# Patient Record
Sex: Female | Born: 1960
Health system: Southern US, Community
[De-identification: ages and names within clinical notes are randomized; demographics above are authoritative.]

## PROBLEM LIST (undated history)

## (undated) HISTORY — PX: TONSILLECTOMY: SUR1361

## (undated) HISTORY — PX: TUBAL LIGATION: SHX77

---

## 1999-06-11 ENCOUNTER — Encounter: Payer: Self-pay | Admitting: Family Medicine

## 1999-06-11 ENCOUNTER — Encounter: Admission: RE | Admit: 1999-06-11 | Discharge: 1999-06-11 | Payer: Self-pay | Admitting: Family Medicine

## 2005-05-09 ENCOUNTER — Other Ambulatory Visit: Admission: RE | Admit: 2005-05-09 | Discharge: 2005-05-09 | Payer: Self-pay | Admitting: Family Medicine

## 2005-06-13 ENCOUNTER — Encounter: Admission: RE | Admit: 2005-06-13 | Discharge: 2005-06-13 | Payer: Self-pay | Admitting: Family Medicine

## 2006-04-11 ENCOUNTER — Ambulatory Visit: Payer: Self-pay | Admitting: Internal Medicine

## 2008-07-21 ENCOUNTER — Other Ambulatory Visit: Admission: RE | Admit: 2008-07-21 | Discharge: 2008-07-21 | Payer: Self-pay | Admitting: Family Medicine

## 2008-07-21 ENCOUNTER — Encounter: Admission: RE | Admit: 2008-07-21 | Discharge: 2008-07-21 | Payer: Self-pay | Admitting: Family Medicine

## 2009-07-21 ENCOUNTER — Other Ambulatory Visit: Admission: RE | Admit: 2009-07-21 | Discharge: 2009-07-21 | Payer: Self-pay | Admitting: Family Medicine

## 2010-07-27 ENCOUNTER — Other Ambulatory Visit
Admission: RE | Admit: 2010-07-27 | Discharge: 2010-07-27 | Payer: Self-pay | Source: Home / Self Care | Admitting: Family Medicine

## 2010-08-30 ENCOUNTER — Encounter
Admission: RE | Admit: 2010-08-30 | Discharge: 2010-08-30 | Payer: Self-pay | Source: Home / Self Care | Attending: Family Medicine | Admitting: Family Medicine

## 2010-09-12 ENCOUNTER — Encounter
Admission: RE | Admit: 2010-09-12 | Discharge: 2010-09-12 | Payer: Self-pay | Source: Home / Self Care | Attending: Family Medicine | Admitting: Family Medicine

## 2010-09-18 ENCOUNTER — Other Ambulatory Visit: Payer: Self-pay | Admitting: Radiology

## 2010-09-18 ENCOUNTER — Encounter
Admission: RE | Admit: 2010-09-18 | Discharge: 2010-09-18 | Payer: Self-pay | Source: Home / Self Care | Attending: Family Medicine | Admitting: Family Medicine

## 2010-10-09 ENCOUNTER — Other Ambulatory Visit: Payer: Self-pay | Admitting: Surgery

## 2010-10-09 DIAGNOSIS — R928 Other abnormal and inconclusive findings on diagnostic imaging of breast: Secondary | ICD-10-CM

## 2010-11-15 ENCOUNTER — Encounter (HOSPITAL_BASED_OUTPATIENT_CLINIC_OR_DEPARTMENT_OTHER)
Admission: RE | Admit: 2010-11-15 | Discharge: 2010-11-15 | Disposition: A | Discharge status: Home / Self Care | Payer: BC Managed Care – PPO | Source: Ambulatory Visit | Attending: Surgery | Admitting: Surgery

## 2010-11-16 ENCOUNTER — Other Ambulatory Visit: Payer: Self-pay | Admitting: Surgery

## 2010-11-16 ENCOUNTER — Ambulatory Visit
Admission: RE | Admit: 2010-11-16 | Discharge: 2010-11-16 | Disposition: A | Discharge status: Home / Self Care | Payer: BC Managed Care – PPO | Source: Ambulatory Visit | Attending: Surgery | Admitting: Surgery

## 2010-11-16 ENCOUNTER — Ambulatory Visit (HOSPITAL_BASED_OUTPATIENT_CLINIC_OR_DEPARTMENT_OTHER)
Admission: RE | Admit: 2010-11-16 | Discharge: 2010-11-16 | Disposition: A | Discharge status: Home / Self Care | Payer: BC Managed Care – PPO | Source: Ambulatory Visit | Attending: Surgery | Admitting: Surgery

## 2010-11-16 DIAGNOSIS — N6089 Other benign mammary dysplasias of unspecified breast: Secondary | ICD-10-CM | POA: Insufficient documentation

## 2010-11-16 DIAGNOSIS — Z01812 Encounter for preprocedural laboratory examination: Secondary | ICD-10-CM | POA: Insufficient documentation

## 2010-11-16 DIAGNOSIS — R928 Other abnormal and inconclusive findings on diagnostic imaging of breast: Secondary | ICD-10-CM

## 2010-11-16 LAB — POCT HEMOGLOBIN-HEMACUE: Hemoglobin: 14.7 g/dL (ref 12.0–15.0)

## 2010-12-03 NOTE — Op Note (Signed)
  NAME:  Dawn Walter, Dawn Walter                  ACCOUNT NO.:  [id number]  MEDICAL RECORD NO.:  [id number]           PATIENT TYPE:  AMB  LOCATION:                               FACILITY:  [facility]  PHYSICIAN:  Wilmon Arms. Corliss Skains, M.D. DATE OF BIRTH:  [birth year]  DATE OF PROCEDURE:  11/16/2010 DATE OF DISCHARGE:                              OPERATIVE REPORT   PREOPERATIVE DIAGNOSIS:  Abnormal left mammogram.  POSTOPERATIVE DIAGNOSIS:  Abnormal left mammogram.  PROCEDURE:  Left needle-localized lumpectomy.  SURGEON:  Wilmon Arms. Corliss Skains, MD  ANESTHESIA:  General.  INDICATIONS:  This is a 50 year old female, who [facility]  She was noted to have abnormality in the left breast at 3 o'clock.  Ultrasound showed a 9 x 6 x 3 mm hypoechoic mass of the left breast.  Needle biopsy showed fibrocystic changes with extensive apocrine metaplasia.  After further discussion, the patient presents for excisional biopsy of this area.  DESCRIPTION OF PROCEDURE:  The patient was brought to the operating room and placed in supine position on operating table.  A wire had previously been placed by Radiology.  After an adequate level of general anesthesia was obtained, her left breast was prepped with ChloraPrep and draped in sterile fashion.  Time-out was taken to ensure the proper patient, proper procedure.  We made a transverse elliptical incision around the wire.  Dissection was carried down into the breast tissue.  We raised a cylinder of tissue around the wire.  We extended this down about 6-cm deep until we passed the tip of the wire.  The specimen was removed and was oriented with a kit.  We inspected the biopsy cavity for hemostasis. Faxitron image showed that the clip was in the center of the specimen and that the wire was intact.  This was sent for pathologic examination. The wound was closed with a deep layer of 3-0 Vicryl and a subcuticular 4-0  Monocryl.  Steri-Strips and clean dressing were applied.  The patient was then extubated and brought to the recovery room in stable condition.  All sponge, instrument, and needle counts were correct.     Wilmon Arms. Corliss Skains, M.D.     MKT/MEDQ  D:  11/16/2010  T:  11/17/2010  Job:  [id number]  cc:   L. Lupe Carney, M.D.  Electronically Signed by Manus Rudd M.D. on 12/03/2010 10:40:30 AM

## 2011-01-04 NOTE — Assessment & Plan Note (Signed)
[facility]                               PULMONARY OFFICE NOTE   NAME:Dawn Walter, Dawn Walter                         MRN:          [id number]  DATE:04/11/2006                            DOB:          [birth year]    PULMONARY NEW PATIENT EVALUATION:   HISTORY:  This is a 50 year old white female with paroxysms of dyspnea  starting 2 months ago with no obvious trigger.  She quite smoking 23 years  ago, but denies any chronic respiratory complaints then, and actually does  not have reproducible or proportionate dyspnea with exertion or activity.  She also has no nocturnal events.  She states that she suddenly loses her  breath for no reason associated with minimal cough, chest tightness, but no  pleuritic pain, fever, chills, sweats, orthopnea, PND or leg swelling, and  she has not found anything that makes it better or worse, nor consulted her  primary physician, Dr. Lupe Carney.   PAST MEDICAL HISTORY:  1. Cardiac rhythm problems.  2. Allergies.   MEDICATIONS:  None daily.   SOCIAL HISTORY:  She quite smoking 23 years ago.  Works as a Catering manager.   FAMILY HISTORY:  Recorded in detail and reviewed with the patient.  Negative  for respiratory diseases or cardiac disease.   REVIEW OF SYSTEMS:  Taken in detail on the worksheet.  Significant for the  problems as outlined above.   PHYSICAL EXAMINATION:  GENERAL:  This is a pleasant white female who laughs  inappropriately at serious medical questions.  VITAL SIGNS:  Stable.  O2 saturation 97% on room air.  HEENT:  Unremarkable.  Oropharynx is clear.  Nasal turbinates normal.  Ear  canals clear bilaterally.  LUNGS:  Lung fields are perfectly clear bilaterally to auscultation and  percussion with excellent air movement.  HEART:  Regular rate and rhythm without murmur, gallop, or rub.  ABDOMEN:  Soft, benign.  EXTREMITIES:  Warm without calf tenderness, cyanosis, clubbing or edema.   CHEST X-RAY:   Pending.   LABORATORY DATA:  Pending.   IMPRESSION:  Paroxysms of dyspnea that do not recur reproducibly with  exertion, and, at the same time, has apparently exercise tolerance and no  effect on sleep.  Sounds functional to me.  This, plus her personality,  would strongly suggest this diagnosis, and wonder about the possibility of  mitral valve prolapse syndrome here.   However, because this is a new onset 2 months ago abruptly, I will recommend  a work-up with basic lab work, chest x-ray and a trial of PPI therapy in the  form of Protonix taken 40 mg before breakfast daily along with dietary  information directed at gastroesophageal reflux disease, since  gastroesophageal reflux disease certainly can cause atypical chest pain and  dyspnea syndromes in middle-aged females.   I did explain hyperventilation syndrome to the patient as a failure to  relax between breaths to try to help her understand the problem, but do not  recommend any anxiolytics at this point through this office.   Followup here will be in 4 weeks,  sooner p.r.n.                                   Charlaine Dalton. Sherene Sires, MD, [facility]   MBW/MedQ  DD:  04/12/2006  DT:  04/12/2006  Job #:  [id number]   cc:   L. Lupe Carney, MD

## 2011-01-22 ENCOUNTER — Encounter (INDEPENDENT_AMBULATORY_CARE_PROVIDER_SITE_OTHER): Payer: Self-pay | Admitting: Surgery

## 2011-08-14 ENCOUNTER — Other Ambulatory Visit (HOSPITAL_COMMUNITY)
Admission: RE | Admit: 2011-08-14 | Discharge: 2011-08-14 | Disposition: A | Discharge status: Home / Self Care | Payer: BC Managed Care – PPO | Source: Ambulatory Visit | Attending: Family Medicine | Admitting: Family Medicine

## 2011-08-14 ENCOUNTER — Other Ambulatory Visit: Payer: Self-pay | Admitting: Physician Assistant

## 2011-08-14 DIAGNOSIS — Z124 Encounter for screening for malignant neoplasm of cervix: Secondary | ICD-10-CM | POA: Insufficient documentation

## 2011-10-11 ENCOUNTER — Other Ambulatory Visit: Payer: Self-pay | Admitting: Gastroenterology

## 2012-04-01 DIAGNOSIS — D126 Benign neoplasm of colon, unspecified: Secondary | ICD-10-CM | POA: Diagnosis present

## 2012-04-01 NOTE — H&P (Signed)
  Date of Initial H&P: 03/20/12  History reviewed, patient examined, no change in status, stable for surgery.

## 2012-04-01 NOTE — Interval H&P Note (Signed)
History and Physical Interval Note:  04/01/2012 5:48 PM  Dawn Walter  has presented today for surgery, with the diagnosis of hx polyp  The various methods of treatment have been discussed with the patient and family. After consideration of risks, benefits and other options for treatment, the patient has consented to  Procedure(s) (LRB): COLONOSCOPY (N/A) HOT HEMOSTASIS (ARGON PLASMA COAGULATION/BICAP) (N/A) as a surgical intervention .  The patient's history has been reviewed, patient examined, no change in status, stable for surgery.  I have reviewed the patient's chart and labs.  Questions were answered to the patient's satisfaction.     Elyse Prevo C.

## 2012-04-02 ENCOUNTER — Encounter (HOSPITAL_COMMUNITY): Payer: Self-pay | Admitting: Anesthesiology

## 2012-04-02 ENCOUNTER — Encounter (HOSPITAL_COMMUNITY): Payer: Self-pay | Admitting: *Deleted

## 2012-04-02 ENCOUNTER — Encounter (HOSPITAL_COMMUNITY)
Admission: RE | Disposition: A | Discharge status: Home / Self Care | Payer: Self-pay | Source: Ambulatory Visit | Attending: Gastroenterology

## 2012-04-02 ENCOUNTER — Ambulatory Visit (HOSPITAL_COMMUNITY): Payer: BC Managed Care – PPO | Admitting: Anesthesiology

## 2012-04-02 ENCOUNTER — Ambulatory Visit (HOSPITAL_COMMUNITY)
Admission: RE | Admit: 2012-04-02 | Discharge: 2012-04-02 | Disposition: A | Discharge status: Home / Self Care | Payer: BC Managed Care – PPO | Source: Ambulatory Visit | Attending: Gastroenterology | Admitting: Gastroenterology

## 2012-04-02 DIAGNOSIS — K648 Other hemorrhoids: Secondary | ICD-10-CM | POA: Insufficient documentation

## 2012-04-02 DIAGNOSIS — D126 Benign neoplasm of colon, unspecified: Secondary | ICD-10-CM | POA: Insufficient documentation

## 2012-04-02 DIAGNOSIS — K5730 Diverticulosis of large intestine without perforation or abscess without bleeding: Secondary | ICD-10-CM | POA: Insufficient documentation

## 2012-04-02 HISTORY — PX: COLONOSCOPY: SHX5424

## 2012-04-02 HISTORY — PX: HOT HEMOSTASIS: SHX5433

## 2012-04-02 SURGERY — COLONOSCOPY
Anesthesia: Monitor Anesthesia Care

## 2012-04-02 MED ORDER — LIDOCAINE HCL (CARDIAC) 20 MG/ML IV SOLN
INTRAVENOUS | Status: DC | PRN
  Administered 2012-04-02: 50 mg via INTRAVENOUS

## 2012-04-02 MED ORDER — LACTATED RINGERS IV SOLN
INTRAVENOUS | Status: DC
  Administered 2012-04-02: 1000 mL via INTRAVENOUS

## 2012-04-02 MED ORDER — MIDAZOLAM HCL 5 MG/5ML IJ SOLN
INTRAMUSCULAR | Status: DC | PRN
  Administered 2012-04-02 (×2): 1 mg via INTRAVENOUS

## 2012-04-02 MED ORDER — KETAMINE HCL 10 MG/ML IJ SOLN
INTRAMUSCULAR | Status: DC | PRN
  Administered 2012-04-02 (×2): 10 mg via INTRAVENOUS

## 2012-04-02 MED ORDER — FENTANYL CITRATE 0.05 MG/ML IJ SOLN: 25.0000 ug | INTRAMUSCULAR | Status: DC | PRN

## 2012-04-02 MED ORDER — PROPOFOL 10 MG/ML IV EMUL
INTRAVENOUS | Status: DC | PRN
  Administered 2012-04-02: 160 ug/kg/min via INTRAVENOUS

## 2012-04-02 MED ORDER — LACTATED RINGERS IV SOLN
INTRAVENOUS | Status: DC | PRN
  Administered 2012-04-02 (×2): via INTRAVENOUS

## 2012-04-02 MED ORDER — FENTANYL CITRATE 0.05 MG/ML IJ SOLN
INTRAMUSCULAR | Status: DC | PRN
  Administered 2012-04-02 (×2): 50 ug via INTRAVENOUS

## 2012-04-02 MED ORDER — SODIUM CHLORIDE 0.9 % IV SOLN: INTRAVENOUS | Status: DC

## 2012-04-02 NOTE — Preoperative (Signed)
Beta Blockers   Reason not to administer Beta Blockers:Not Applicable 

## 2012-04-02 NOTE — Transfer of Care (Signed)
Immediate Anesthesia Transfer of Care Note  Patient: Dawn Walter  Procedure(s) Performed: Procedure(s) (LRB): COLONOSCOPY (N/A) HOT HEMOSTASIS (ARGON PLASMA COAGULATION/BICAP) (N/A)  Patient Location: PACU and Endoscopy Unit  Anesthesia Type: MAC  Level of Consciousness: awake, alert , oriented and patient cooperative  Airway & Oxygen Therapy: Patient Spontanous Breathing and Patient connected to face mask oxygen  Post-op Assessment: Report given to PACU RN, Post -op Vital signs reviewed and stable and Patient moving all extremities  Post vital signs: Reviewed and stable  Complications: No apparent anesthesia complications

## 2012-04-02 NOTE — Anesthesia Postprocedure Evaluation (Signed)
  Anesthesia Post-op Note  Patient: Dawn Walter  Procedure(s) Performed: Procedure(s) (LRB): COLONOSCOPY (N/A) HOT HEMOSTASIS (ARGON PLASMA COAGULATION/BICAP) (N/A)  Patient Location: PACU  Anesthesia Type: MAC  Level of Consciousness: awake and alert   Airway and Oxygen Therapy: Patient Spontanous Breathing  Post-op Pain: mild  Post-op Assessment: Post-op Vital signs reviewed, Patient's Cardiovascular Status Stable, Respiratory Function Stable, Patent Airway and No signs of Nausea or vomiting  Post-op Vital Signs: stable  Complications: No apparent anesthesia complications

## 2012-04-02 NOTE — Anesthesia Preprocedure Evaluation (Signed)
Anesthesia Evaluation  Patient identified by MRN, date of birth, ID band Patient awake    Reviewed: Allergy & Precautions, H&P , NPO status , Patient's Chart, lab work & pertinent test results  Airway Mallampati: II TM Distance: >3 FB Neck ROM: full    Dental No notable dental hx.    Pulmonary neg pulmonary ROS,  breath sounds clear to auscultation  Pulmonary exam normal       Cardiovascular Exercise Tolerance: Good negative cardio ROS  Rhythm:regular Rate:Normal     Neuro/Psych negative neurological ROS  negative psych ROS   GI/Hepatic negative GI ROS, Neg liver ROS,   Endo/Other  negative endocrine ROS  Renal/GU negative Renal ROS  negative genitourinary   Musculoskeletal   Abdominal   Peds  Hematology negative hematology ROS (+)   Anesthesia Other Findings   Reproductive/Obstetrics negative OB ROS                           Anesthesia Physical Anesthesia Plan  ASA: I  Anesthesia Plan: MAC   Post-op Pain Management:    Induction:   Airway Management Planned:   Additional Equipment:   Intra-op Plan:   Post-operative Plan:   Informed Consent: I have reviewed the patients History and Physical, chart, labs and discussed the procedure including the risks, benefits and alternatives for the proposed anesthesia with the patient or authorized representative who has indicated his/her understanding and acceptance.   Dental Advisory Given  Plan Discussed with: CRNA and Surgeon  Anesthesia Plan Comments:         Anesthesia Quick Evaluation

## 2012-04-02 NOTE — Interval H&P Note (Signed)
History and Physical Interval Note:  04/02/2012 9:22 AM  Dawn Walter  has presented today for surgery, with the diagnosis of hx polyp  The various methods of treatment have been discussed with the patient and family. After consideration of risks, benefits and other options for treatment, the patient has consented to  Procedure(s) (LRB): COLONOSCOPY (N/A) HOT HEMOSTASIS (ARGON PLASMA COAGULATION/BICAP) (N/A) as a surgical intervention .  The patient's history has been reviewed, patient examined, no change in status, stable for surgery.  I have reviewed the patient's chart and labs.  Questions were answered to the patient's satisfaction.     [name] C.

## 2012-04-02 NOTE — Op Note (Signed)
[facility] [address] [place], [place]  [address]  COLONOSCOPY PROCEDURE REPORT  PATIENT:  Dawn, Walter  MR#:  [id number] BIRTHDATE:  18-Apr-1961, 51 yrs. old  GENDER:  female ENDOSCOPIST:  Charlott Rakes, MD REF. BY: PROCEDURE DATE:  04/02/2012 PROCEDURE:  Colonoscopy with snare polypectomy and APC ASA CLASS:  Class I INDICATIONS:   history colon polyp MEDICATIONS:  None  DESCRIPTION OF PROCEDURE:   After the risks benefits and alternatives of the procedure were thoroughly explained, informed consent was obtained.  The Pentax Ped Colon [id number] endoscope was introduced through the anus and advanced to the cecum where the ileocecal valve and appendiceal orifice were identified. Due to excessive looping repeated loop reduction was necessary.  The quality of the prep was fair but repeated irrigation led to a good and adequate prep.  The instrument was then slowly withdrawn as the colon was fully examined. <<PROCEDUREIMAGES>>  FINDINGS:  Rectal exam unremarkable.  Pediatric colonoscope inserted into the colon and advanced to the cecum, where the appendiceal orifice and ileocecal valve were identified.    During advancement into the descending colon the previous tattoo site was noted and residual polyp was seen. On careful withdrawal of the colonoscope this polyp was attempted to be removed with snare cautery but due to the difficult angle, saline was injected into the proximal edge of polyp to raise up the proximal portion toward the scope.  After injecting saline snare cautery was used to remove the polyp in piecemeal fashion. A small portion of the residual polyp  could not be removed with the snare cautery due to its flat nature. Argon plasma coagulation was then used to fulgurate the remaining portion of the polyp with good results. A few sigmoid diverticula were noted on withdrawal.  Retroflexion revealed small nonthrombosed internal  hemorrhoids  COMPLICATIONS:  None  IMPRESSION:     1. Residual ascending colon polyp removed with snare cautery and argon plasma coagulation-no residual polyp remaining after these techniques were performed 2. Sigmoid diverticulosis 3. Internal hemorrhoids  RECOMMENDATIONS:  1. F/U on path 2. Consider repeat colonoscopy in 1 year to look at polypectomy site  ______________________________ Charlott Rakes, MD  CC:  n. eSIGNEDCharlott Rakes at 04/02/2012 10:46 AM  Dawn Walter, Dawn Walter, [phone]

## 2012-04-03 ENCOUNTER — Encounter (HOSPITAL_COMMUNITY): Payer: Self-pay | Admitting: Gastroenterology

## 2012-08-17 ENCOUNTER — Other Ambulatory Visit (HOSPITAL_COMMUNITY)
Admission: RE | Admit: 2012-08-17 | Discharge: 2012-08-17 | Disposition: A | Discharge status: Home / Self Care | Payer: BC Managed Care – PPO | Source: Ambulatory Visit | Attending: Family Medicine | Admitting: Family Medicine

## 2012-08-17 ENCOUNTER — Other Ambulatory Visit: Payer: Self-pay | Admitting: Physician Assistant

## 2012-08-17 ENCOUNTER — Other Ambulatory Visit: Payer: Self-pay | Admitting: Family Medicine

## 2012-08-17 DIAGNOSIS — Z01419 Encounter for gynecological examination (general) (routine) without abnormal findings: Secondary | ICD-10-CM | POA: Insufficient documentation

## 2012-08-17 DIAGNOSIS — Z1231 Encounter for screening mammogram for malignant neoplasm of breast: Secondary | ICD-10-CM

## 2012-09-03 ENCOUNTER — Ambulatory Visit
Admission: RE | Admit: 2012-09-03 | Discharge: 2012-09-03 | Disposition: A | Discharge status: Home / Self Care | Payer: BC Managed Care – PPO | Source: Ambulatory Visit | Attending: Family Medicine | Admitting: Family Medicine

## 2012-09-03 DIAGNOSIS — Z1231 Encounter for screening mammogram for malignant neoplasm of breast: Secondary | ICD-10-CM

## 2013-05-11 ENCOUNTER — Ambulatory Visit (INDEPENDENT_AMBULATORY_CARE_PROVIDER_SITE_OTHER): Payer: BC Managed Care – PPO | Admitting: Family Medicine

## 2013-05-11 VITALS — BP 130/80 | HR 70 | Temp 98.0°F | Resp 16 | Ht 64.25 in | Wt 173.6 lb

## 2013-05-11 DIAGNOSIS — R05 Cough: Secondary | ICD-10-CM

## 2013-05-11 DIAGNOSIS — R5381 Other malaise: Secondary | ICD-10-CM

## 2013-05-11 DIAGNOSIS — R52 Pain, unspecified: Secondary | ICD-10-CM

## 2013-05-11 DIAGNOSIS — R059 Cough, unspecified: Secondary | ICD-10-CM

## 2013-05-11 LAB — POCT CBC
MCH, POC: 30.7 pg (ref 27–31.2)
MCHC: 32.2 g/dL (ref 31.8–35.4)
MID (cbc): 0.4 (ref 0–0.9)
MPV: 11.2 fL (ref 0–99.8)
POC MID %: 6.2 %M (ref 0–12)
Platelet Count, POC: 279 10*3/uL (ref 142–424)
RBC: 4.72 M/uL (ref 4.04–5.48)
WBC: 7.1 10*3/uL (ref 4.6–10.2)

## 2013-05-11 MED ORDER — OSELTAMIVIR PHOSPHATE 75 MG PO CAPS: 75.0000 mg | ORAL_CAPSULE | Freq: Two times a day (BID) | ORAL | Status: DC

## 2013-05-11 MED ORDER — HYDROCODONE-HOMATROPINE 5-1.5 MG/5ML PO SYRP: 5.0000 mL | ORAL_SOLUTION | Freq: Three times a day (TID) | ORAL | Status: DC | PRN

## 2013-05-11 NOTE — Patient Instructions (Addendum)
Use the cough syrup as needed.  Rest and drink plenty of fluids.  Use the cough syrup as needed. Remember it will make you drowsy, so do not drive while you are taking this.     Let me know if you are not better in the next couple of days.    We can also try tamiflu in case you may have the flu.

## 2013-05-11 NOTE — Progress Notes (Signed)
[facility] [address], [place] [address] [phone]  Date:  05/11/2013   Name:  Dawn Walter   DOB:  03-13-1961   MRN:  [id number]  PCP:  No primary provider on file.    Chief Complaint: Generalized Body Aches   History of Present Illness:  Dawn Walter is a 52 y.o. very pleasant female patient who presents with the following:  Here today with illness.  Today is Tuesday.  On Saturday she noted body aches, sinus pain, my eyes hurt.  "Everything hurts."  She has not noted any fever- she has checked her temperature a couple of times. She does have some cough.  "I feel like I've been run over by a semi- truck."   It "burns" when she blows her nose.   She does not have any GI sx- no nausea, vomiting or diarrhea.   She has taken nyquil, alka seltzer, robitussin.  No meds so far today.    She is generally healthy Menopause completed.    Here with her daughter today who is also ill with similar sx  Patient Active Problem List   Diagnosis Date Noted  . Benign neoplasm of colon 04/01/2012    No past medical history on file.  Past Surgical History  Procedure Laterality Date  . Cesarean section    . Colonoscopy  04/02/2012    Procedure: COLONOSCOPY;  Surgeon: Shirley Friar, MD;  Location: [facility] ENDOSCOPY;  Service: Endoscopy;  Laterality: N/A;  . Hot hemostasis  04/02/2012    Procedure: HOT HEMOSTASIS (ARGON PLASMA COAGULATION/BICAP);  Surgeon: Shirley Friar, MD;  Location: [facility];  Service: Endoscopy;  Laterality: N/A;    History  Substance Use Topics  . Smoking status: Never Smoker   . Smokeless tobacco: Not on file  . Alcohol Use: Yes     Comment: WEEKENDS SOMETIMES    No family history on file.  No Known Allergies  Medication list has been reviewed and updated.  Current Outpatient Prescriptions on File Prior to Visit  Medication Sig Dispense Refill  . PHENTERMINE HCL PO Take 30 mg by mouth. ALSO, HCG INJECTION.1 A WEEK         No current facility-administered medications on file prior to visit.    Review of Systems:  As per HPI- otherwise negative.   Physical Examination: Filed Vitals:   05/11/13 0811  BP: 130/80  Pulse: 70  Temp: 98 F (36.7 C)  Resp: 16   Filed Vitals:   05/11/13 0811  Height: 5' 4.25" (1.632 m)  Weight: 173 lb 9.6 oz (78.744 kg)   Body mass index is 29.56 kg/(m^2). Ideal Body Weight: Weight in (lb) to have BMI = 25: 146.5  GEN: WDWN, NAD, Non-toxic, A & O x 3, overweight.   HEENT: Atraumatic, Normocephalic. Neck supple. No masses, No LAD.  Bilateral TM wnl, oropharynx normal.  PEERL,EOMI.   Ears and Nose: No external deformity. CV: RRR, No M/G/R. No JVD. No thrill. No extra heart sounds. PULM: CTA B, no wheezes, crackles, rhonchi. No retractions. No resp. distress. No accessory muscle use. ABD: S, NT, ND. No rebound. No HSM. EXTR: No c/c/e NEURO Normal gait.  PSYCH: Normally interactive. Conversant. Not depressed or anxious appearing.  Calm demeanor.   Results for orders placed in visit on 05/11/13  POCT CBC      Result Value Range   WBC 7.1  4.6 - 10.2 K/uL   Lymph, poc 2.2  0.6 -  3.4   POC LYMPH PERCENT 31.6  10 - 50 %L   MID (cbc) 0.4  0 - 0.9   POC MID % 6.2  0 - 12 %M   POC Granulocyte 4.4  2 - 6.9   Granulocyte percent 62.2  37 - 80 %G   RBC 4.72  4.04 - 5.48 M/uL   Hemoglobin 14.5  12.2 - 16.2 g/dL   HCT, POC 29.5  62.1 - 47.9 %   MCV 95.6  80 - 97 fL   MCH, POC 30.7  27 - 31.2 pg   MCHC 32.2  31.8 - 35.4 g/dL   RDW, POC 30.8     Platelet Count, POC 279  142 - 424 K/uL   MPV 11.2  0 - 99.8 fL  POCT INFLUENZA A/B      Result Value Range   Influenza A, POC Negative     Influenza B, POC Negative       Assessment and Plan: Body aches - Plan: POCT CBC, POCT Influenza A/B, oseltamivir (TAMIFLU) 75 MG capsule  Other malaise and fatigue  Cough - Plan: HYDROcodone-homatropine (HYCODAN) 5-1.5 MG/5ML syrup  Likely viral illness- possible flu given her  body aches.  She would like to try tamiflu after discussion of possible benefits and risks, and will use hycodan prn.  Cautioned her regarding sedation.  She will let me know if not better soon- Sooner if worse.     Signed Abbe Amsterdam, MD

## 2013-08-25 ENCOUNTER — Other Ambulatory Visit (HOSPITAL_COMMUNITY)
Admission: RE | Admit: 2013-08-25 | Discharge: 2013-08-25 | Disposition: A | Discharge status: Home / Self Care | Payer: BC Managed Care – PPO | Source: Ambulatory Visit | Attending: Family Medicine | Admitting: Family Medicine

## 2013-08-25 ENCOUNTER — Other Ambulatory Visit: Payer: Self-pay | Admitting: Physician Assistant

## 2013-08-25 DIAGNOSIS — Z124 Encounter for screening for malignant neoplasm of cervix: Secondary | ICD-10-CM | POA: Insufficient documentation

## 2013-09-18 ENCOUNTER — Other Ambulatory Visit: Payer: Self-pay | Admitting: Family Medicine

## 2013-11-22 ENCOUNTER — Encounter (HOSPITAL_COMMUNITY): Payer: Self-pay | Admitting: Pharmacy Technician

## 2013-11-24 ENCOUNTER — Other Ambulatory Visit: Payer: Self-pay

## 2013-11-24 DIAGNOSIS — Z1231 Encounter for screening mammogram for malignant neoplasm of breast: Secondary | ICD-10-CM

## 2013-11-25 ENCOUNTER — Encounter (HOSPITAL_COMMUNITY): Payer: Self-pay | Admitting: *Deleted

## 2013-12-02 ENCOUNTER — Ambulatory Visit
Admission: RE | Admit: 2013-12-02 | Discharge: 2013-12-02 | Disposition: A | Discharge status: Home / Self Care | Payer: BC Managed Care – PPO | Source: Ambulatory Visit

## 2013-12-02 DIAGNOSIS — Z1231 Encounter for screening mammogram for malignant neoplasm of breast: Secondary | ICD-10-CM

## 2013-12-13 ENCOUNTER — Other Ambulatory Visit: Payer: Self-pay | Admitting: Gastroenterology

## 2013-12-13 NOTE — Addendum Note (Signed)
Addended by: Wilford Corner on: 12/13/2013 12:44 PM   Modules accepted: Orders

## 2013-12-14 ENCOUNTER — Encounter (HOSPITAL_COMMUNITY): Payer: BC Managed Care – PPO | Admitting: Anesthesiology

## 2013-12-14 ENCOUNTER — Ambulatory Visit (HOSPITAL_COMMUNITY): Payer: BC Managed Care – PPO | Admitting: Anesthesiology

## 2013-12-14 ENCOUNTER — Ambulatory Visit (HOSPITAL_COMMUNITY)
Admission: RE | Admit: 2013-12-14 | Discharge: 2013-12-14 | Disposition: A | Discharge status: Home / Self Care | Payer: BC Managed Care – PPO | Source: Ambulatory Visit | Attending: Gastroenterology | Admitting: Gastroenterology

## 2013-12-14 ENCOUNTER — Encounter (HOSPITAL_COMMUNITY): Payer: Self-pay | Admitting: Anesthesiology

## 2013-12-14 ENCOUNTER — Encounter (HOSPITAL_COMMUNITY)
Admission: RE | Disposition: A | Discharge status: Home / Self Care | Payer: Self-pay | Source: Ambulatory Visit | Attending: Gastroenterology

## 2013-12-14 DIAGNOSIS — Z860100 Personal history of colon polyps, unspecified: Secondary | ICD-10-CM

## 2013-12-14 DIAGNOSIS — K648 Other hemorrhoids: Secondary | ICD-10-CM | POA: Insufficient documentation

## 2013-12-14 DIAGNOSIS — D126 Benign neoplasm of colon, unspecified: Secondary | ICD-10-CM | POA: Insufficient documentation

## 2013-12-14 HISTORY — PX: HOT HEMOSTASIS: SHX5433

## 2013-12-14 HISTORY — PX: COLONOSCOPY WITH PROPOFOL: SHX5780

## 2013-12-14 SURGERY — COLONOSCOPY WITH PROPOFOL
Anesthesia: Monitor Anesthesia Care

## 2013-12-14 MED ORDER — MIDAZOLAM HCL 5 MG/5ML IJ SOLN
INTRAMUSCULAR | Status: DC | PRN
  Administered 2013-12-14: 2 mg via INTRAVENOUS

## 2013-12-14 MED ORDER — PROPOFOL 10 MG/ML IV BOLUS
INTRAVENOUS | Status: AC
  Filled 2013-12-14: qty 20

## 2013-12-14 MED ORDER — SODIUM CHLORIDE 0.9 % IV SOLN: INTRAVENOUS | Status: DC

## 2013-12-14 MED ORDER — FENTANYL CITRATE 0.05 MG/ML IJ SOLN
INTRAMUSCULAR | Status: DC | PRN
  Administered 2013-12-14: 100 ug via INTRAVENOUS

## 2013-12-14 MED ORDER — FENTANYL CITRATE 0.05 MG/ML IJ SOLN
INTRAMUSCULAR | Status: AC
  Filled 2013-12-14: qty 2

## 2013-12-14 MED ORDER — LACTATED RINGERS IV SOLN
INTRAVENOUS | Status: DC
Start: 2013-12-14 — End: 2013-12-14
  Administered 2013-12-14: 1000 mL via INTRAVENOUS

## 2013-12-14 MED ORDER — LACTATED RINGERS IV SOLN
INTRAVENOUS | Status: DC | PRN
  Administered 2013-12-14: 11:00:00 via INTRAVENOUS

## 2013-12-14 MED ORDER — MIDAZOLAM HCL 2 MG/2ML IJ SOLN
INTRAMUSCULAR | Status: AC
  Filled 2013-12-14: qty 2

## 2013-12-14 MED ORDER — PROPOFOL INFUSION 10 MG/ML OPTIME
INTRAVENOUS | Status: DC | PRN
  Administered 2013-12-14: 150 ug/kg/min via INTRAVENOUS

## 2013-12-14 SURGICAL SUPPLY — 21 items

## 2013-12-14 NOTE — H&P (Signed)
  Date of Initial H&P: 12/10/13  History reviewed, patient examined, no change in status, stable for surgery.

## 2013-12-14 NOTE — Interval H&P Note (Signed)
History and Physical Interval Note:  12/14/2013 9:13 AM  Dawn Walter  has presented today for surgery, with the diagnosis of hx of polyps  The various methods of treatment have been discussed with the patient and family. After consideration of risks, benefits and other options for treatment, the patient has consented to  Procedure(s) with comments: COLONOSCOPY WITH PROPOFOL (N/A) - ok to move to later start, per [facility] who spoke to Maricela Schreur/LH HOT HEMOSTASIS (ARGON PLASMA COAGULATION/BICAP) (N/A) as a surgical intervention .  The patient's history has been reviewed, patient examined, no change in status, stable for surgery.  I have reviewed the patient's chart and labs.  Questions were answered to the patient's satisfaction.     [name]

## 2013-12-14 NOTE — Transfer of Care (Signed)
Immediate Anesthesia Transfer of Care Note  Patient: Dawn Walter  Procedure(s) Performed: Procedure(s) (LRB): COLONOSCOPY WITH PROPOFOL (N/A) HOT HEMOSTASIS (ARGON PLASMA COAGULATION/BICAP) (N/A)  Patient Location: PACU  Anesthesia Type: MAC  Level of Consciousness: sedated, patient cooperative and responds to stimulation  Airway & Oxygen Therapy: Patient Spontanous Breathing and Patient connected to face mask oxgen  Post-op Assessment: Report given to PACU RN and Post -op Vital signs reviewed and stable  Post vital signs: Reviewed and stable  Complications: No apparent anesthesia complications

## 2013-12-14 NOTE — Op Note (Signed)
[facility] [place], [address]   COLONOSCOPY PROCEDURE REPORT  PATIENT: Dawn Walter, Dawn Walter  MR#: [id number] BIRTHDATE: 03-21-1961 , 32  yrs. old GENDER: Female ENDOSCOPIST: Wilford Corner, MD REFERRED BY: PROCEDURE DATE:  12/14/2013 PROCEDURE:   Colonoscopy with snare polypectomy ASA CLASS:   Class I INDICATIONS:follow up of adenomatous colonic polyp(s). MEDICATIONS: See Anesthesia Report.  DESCRIPTION OF PROCEDURE:   After the risks benefits and alternatives of the procedure were thoroughly explained, informed consent was obtained.  The Pentax Ped Colon [id number]  endoscope was introduced through the anus and advanced to the cecum, which was identified by both the appendix and ileocecal valve , limited by No adverse events experienced.   The quality of the prep was good. . The instrument was then slowly withdrawn as the colon was fully examined.     FINDINGS:  Rectal exam unremarkable.  Pediatric colonoscope inserted into the colon and advanced to the cecum, where the appendiceal orifice and ileocecal valve were identified. The terminal ileum was intubated and was normal in appearance. During insertion the previously tattooed mucosa was noted in the ascending colon and no residual polyp or recurrence of polyp was noted at this site.    On careful withdrawal of the colonoscope a flat 3 mm polyp was removed from the ascending colon with a cold biopsy forceps. A 4 mm semi-sessile polyp was removed with planned snare cautery but due to technical difficulties the polyp was partially removed prior to cautery and then the remaining portion was removed with cautery. A small amount of bleeding was noted at the polypectomy site that resolved spontaneously. Three small sessile polyps (sizes: 5 mm, 5 mm, 6 mm) were seen in the sigmoid colon and removed with snare cautery without any immediate complications.   Retroflexion revealed small internal  hemorrhoids.  COMPLICATIONS: None  IMPRESSION:     Colon polyps X 5 - removed as above Internal hemorrhoids  RECOMMENDATIONS: F/U on path; No aspirin products for 2 weeks    ______________________________ eSignedWilford Corner, MD 12/14/2013 12:09 PM   CC:  PATIENT NAME:  Thayer, Inabinet MR#: [id number]

## 2013-12-14 NOTE — Discharge Instructions (Addendum)
No aspirin products for 2 weeks. Will call you when the pathology results are complete.

## 2013-12-14 NOTE — Anesthesia Postprocedure Evaluation (Signed)
  Anesthesia Post-op Note  Patient: Dawn Walter  Procedure(s) Performed: Procedure(s) (LRB): COLONOSCOPY WITH PROPOFOL (N/A) HOT HEMOSTASIS (ARGON PLASMA COAGULATION/BICAP) (N/A)  Patient Location: PACU  Anesthesia Type: MAC  Level of Consciousness: awake and alert   Airway and Oxygen Therapy: Patient Spontanous Breathing  Post-op Pain: mild  Post-op Assessment: Post-op Vital signs reviewed, Patient's Cardiovascular Status Stable, Respiratory Function Stable, Patent Airway and No signs of Nausea or vomiting  Last Vitals:  Filed Vitals:   12/14/13 1220  BP: 138/87  Temp:   Resp: 17    Post-op Vital Signs: stable   Complications: No apparent anesthesia complications

## 2013-12-14 NOTE — Anesthesia Preprocedure Evaluation (Addendum)
Anesthesia Evaluation  Patient identified by MRN, date of birth, ID band Patient awake    Reviewed: Allergy & Precautions, H&P , NPO status , Patient's Chart, lab work & pertinent test results  Airway Mallampati: II TM Distance: >3 FB Neck ROM: Full    Dental no notable dental hx.    Pulmonary neg pulmonary ROS,  breath sounds clear to auscultation  Pulmonary exam normal       Cardiovascular negative cardio ROS  Rhythm:Regular Rate:Normal     Neuro/Psych negative neurological ROS  negative psych ROS   GI/Hepatic negative GI ROS, Neg liver ROS,   Endo/Other  negative endocrine ROS  Renal/GU negative Renal ROS  negative genitourinary   Musculoskeletal negative musculoskeletal ROS (+)   Abdominal   Peds negative pediatric ROS (+)  Hematology negative hematology ROS (+)   Anesthesia Other Findings   Reproductive/Obstetrics negative OB ROS                           Anesthesia Physical Anesthesia Plan  ASA: I  Anesthesia Plan: MAC   Post-op Pain Management:    Induction: Intravenous  Airway Management Planned:   Additional Equipment:   Intra-op Plan:   Post-operative Plan:   Informed Consent: I have reviewed the patients History and Physical, chart, labs and discussed the procedure including the risks, benefits and alternatives for the proposed anesthesia with the patient or authorized representative who has indicated his/her understanding and acceptance.   Dental advisory given  Plan Discussed with: CRNA  Anesthesia Plan Comments:         Anesthesia Quick Evaluation  

## 2013-12-15 ENCOUNTER — Encounter (HOSPITAL_COMMUNITY): Payer: Self-pay | Admitting: Gastroenterology

## 2015-07-26 IMAGING — MG MM SCREENING BREAST TOMO BILATERAL
12 of 16 series · 12 of 32 positions shown · non-contrast
Comparison: Previous exam(s)

ACR Breast Density Category a: The breast tissue is almost entirely
fatty.

CLINICAL DATA: Screening.

EXAM:
DIGITAL SCREENING BILATERAL MAMMOGRAM WITH 3D TOMO WITH CAD

[R MLO]
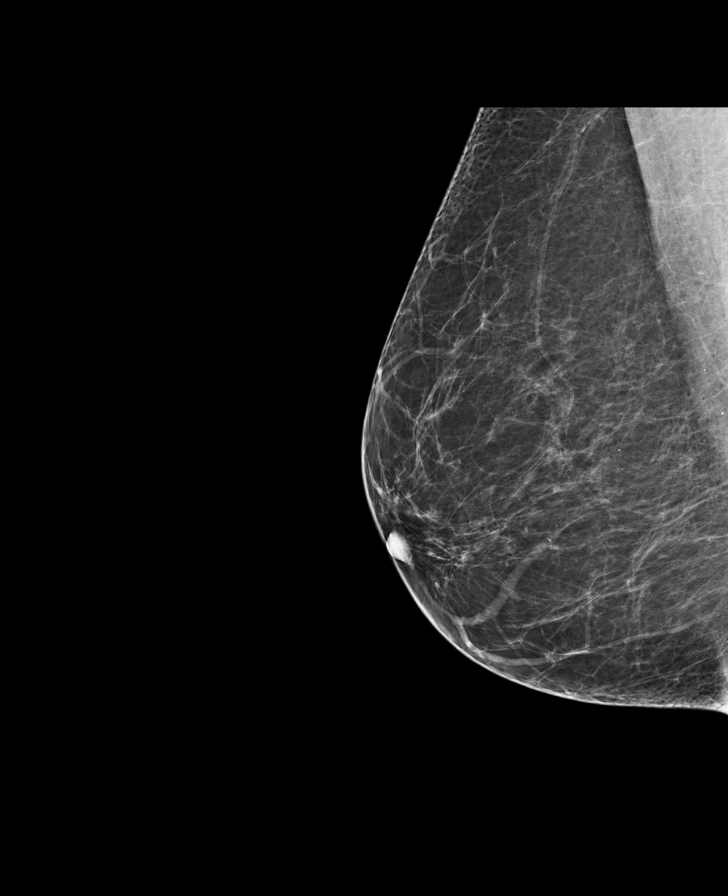

[R MLO synth-2D]
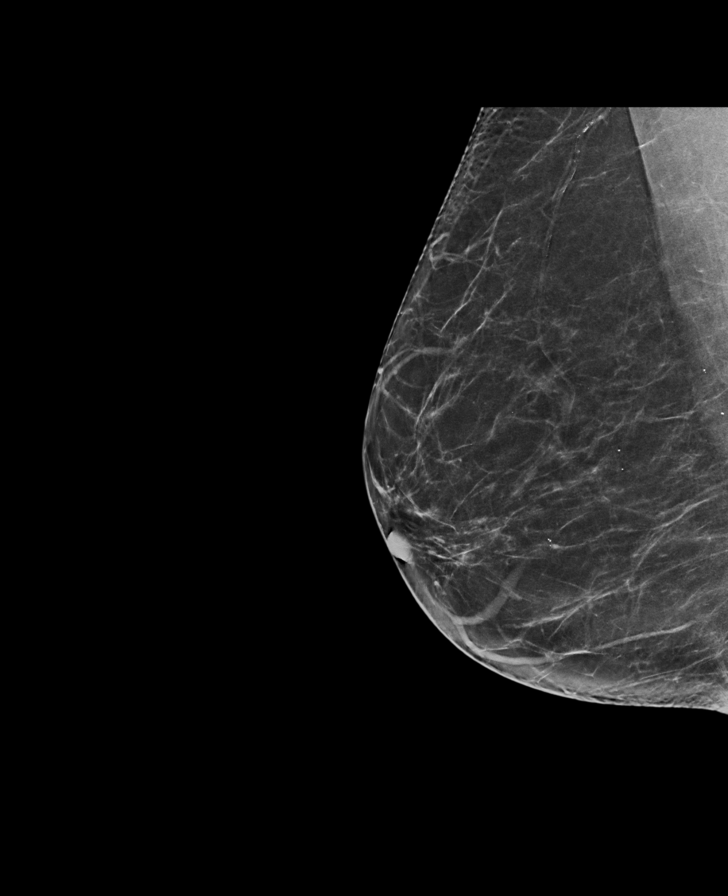

[L CC]
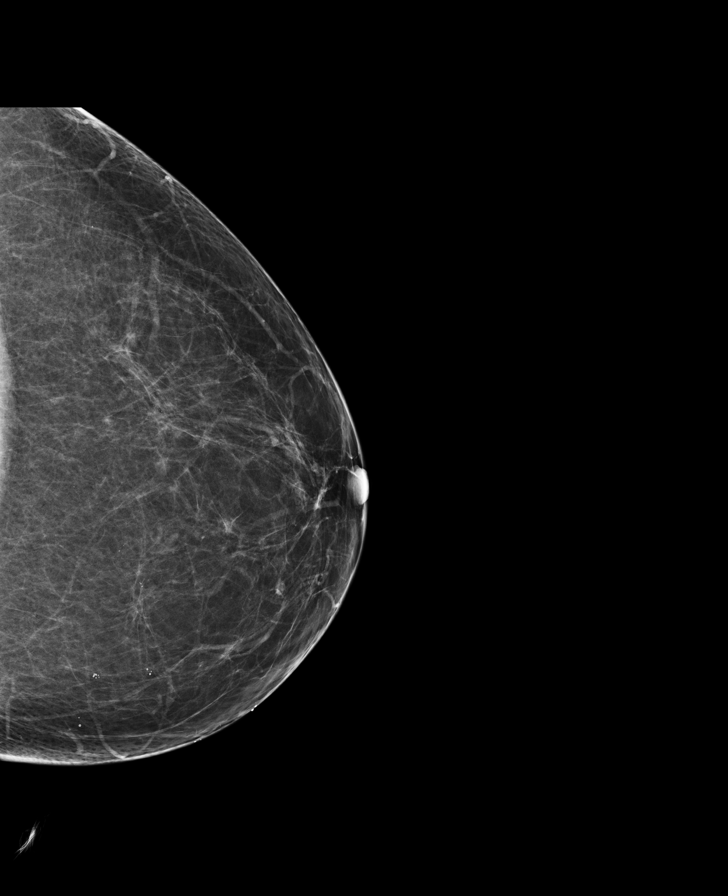

[L MLO synth-2D]
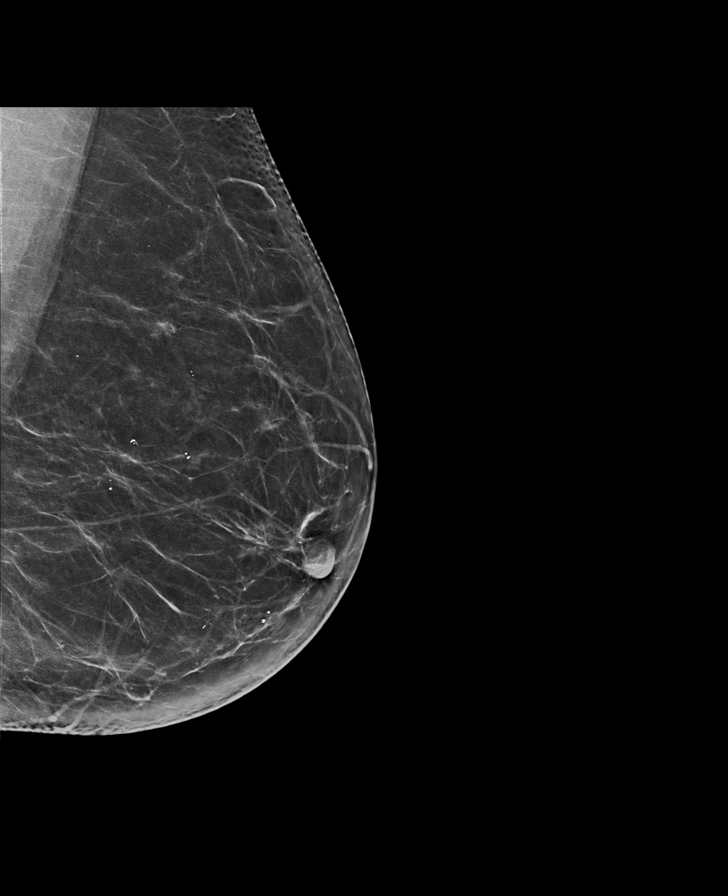

[L CC synth-2D]
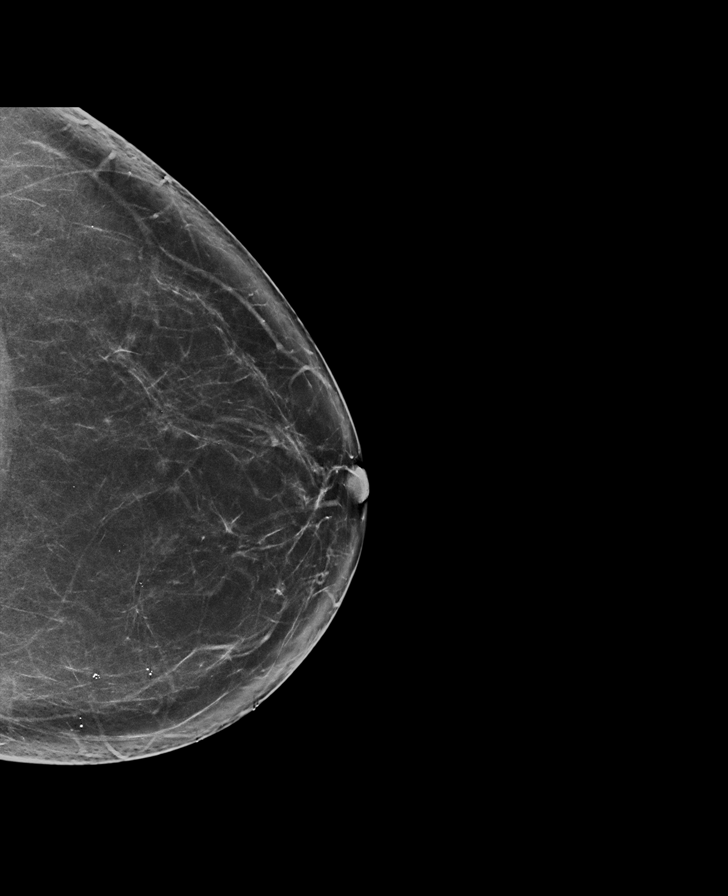

[R CC synth-2D]
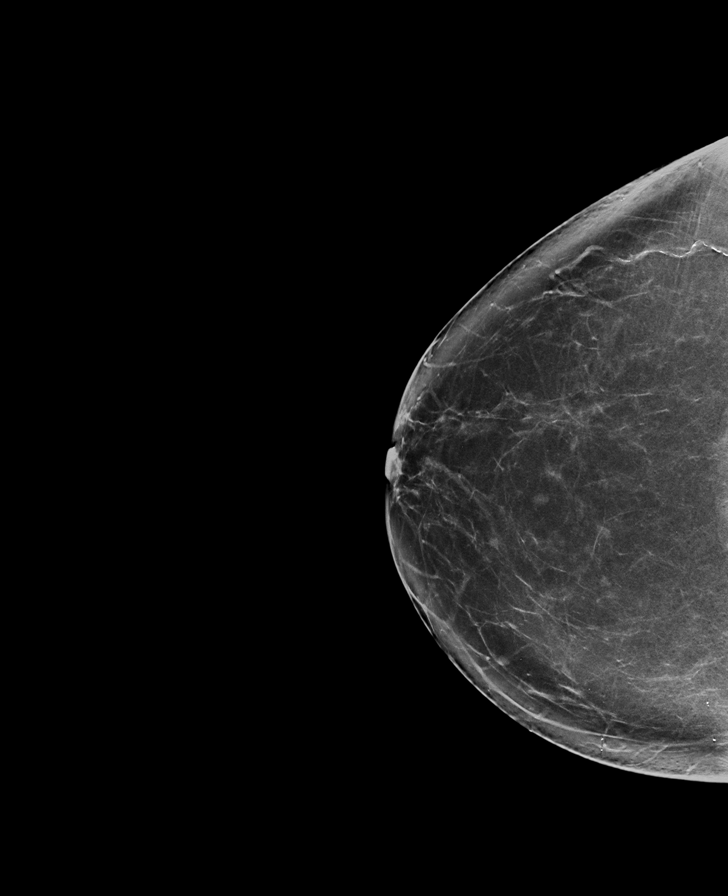

[R CC]
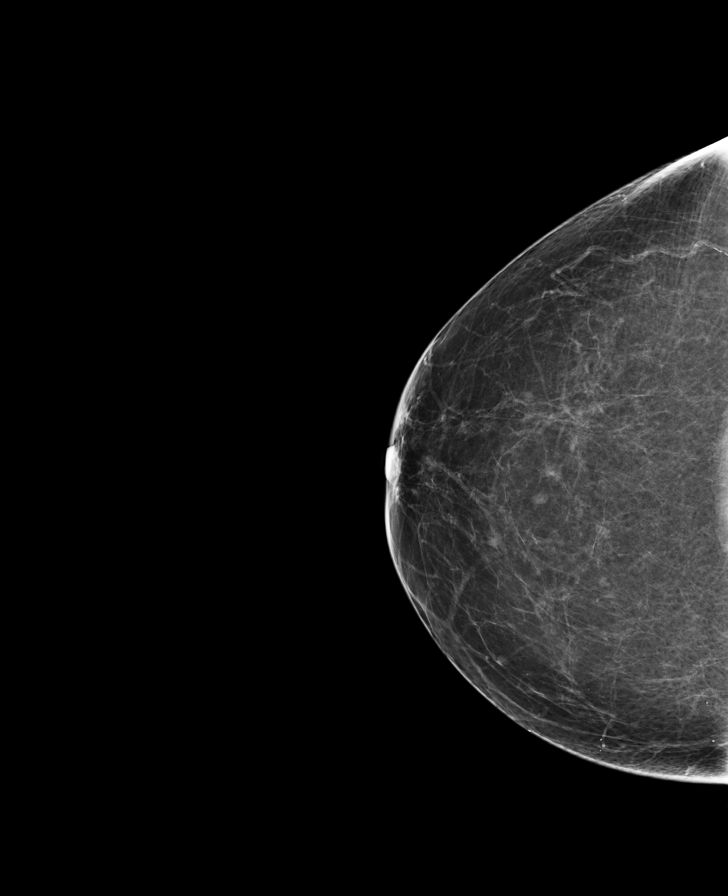

[L MLO]
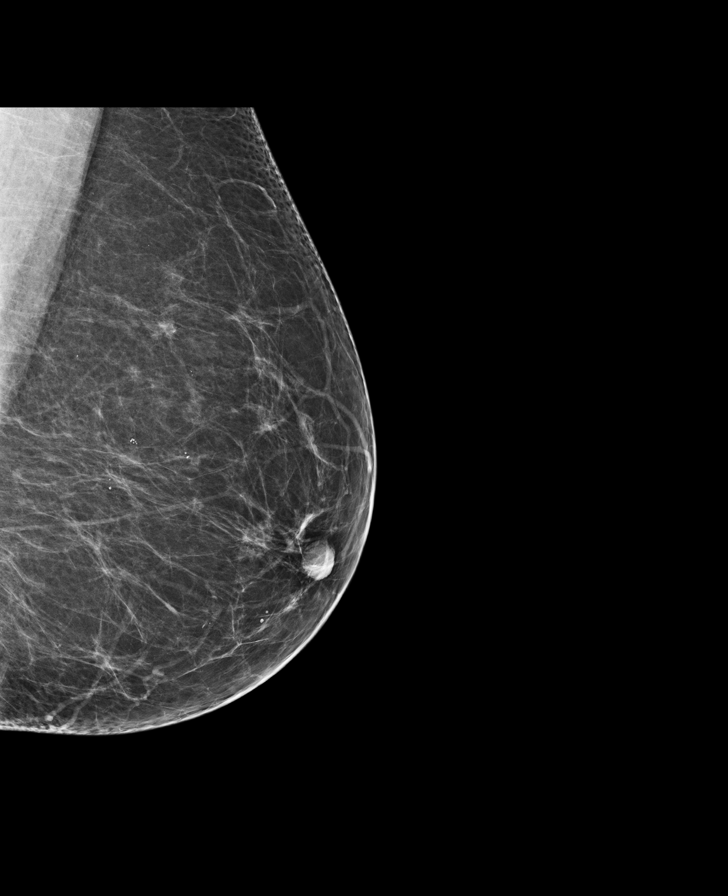

[L MLO tomo]
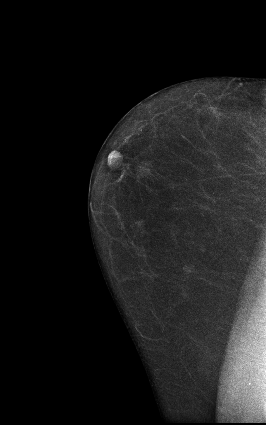

[R CC tomo]
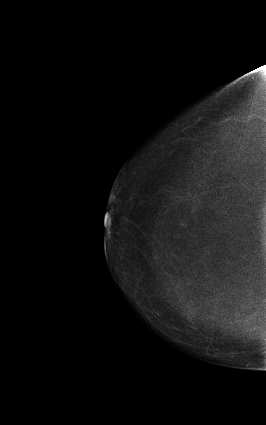

[L CC tomo]
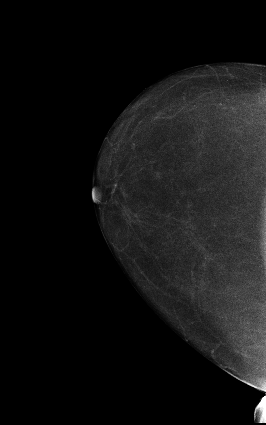

[R MLO tomo]
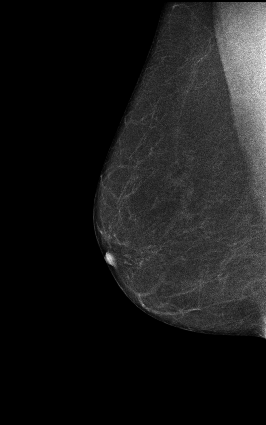

[12 of 32 positions shown; findings below may reference images not displayed]

FINDINGS: There are no findings suspicious for malignancy. Images were
processed with CAD.
IMPRESSION: No mammographic evidence of malignancy. A result letter of this
screening mammogram will be mailed directly to the patient.

RECOMMENDATION:
Screening mammogram in one year. (Code:[id number]-[id number])

BI-RADS CATEGORY  1: Negative.

## 2015-09-25 ENCOUNTER — Other Ambulatory Visit: Payer: Self-pay | Admitting: Physician Assistant

## 2015-09-25 DIAGNOSIS — R51 Headache: Principal | ICD-10-CM

## 2015-09-25 DIAGNOSIS — R519 Headache, unspecified: Secondary | ICD-10-CM

## 2015-09-28 ENCOUNTER — Ambulatory Visit
Admission: RE | Admit: 2015-09-28 | Discharge: 2015-09-28 | Disposition: A | Discharge status: Home / Self Care | Payer: Self-pay | Source: Ambulatory Visit | Attending: Physician Assistant | Admitting: Physician Assistant

## 2015-09-28 DIAGNOSIS — R519 Headache, unspecified: Secondary | ICD-10-CM

## 2015-09-28 DIAGNOSIS — R51 Headache: Principal | ICD-10-CM

## 2016-07-25 DIAGNOSIS — Z1231 Encounter for screening mammogram for malignant neoplasm of breast: Secondary | ICD-10-CM | POA: Diagnosis not present

## 2016-09-12 ENCOUNTER — Other Ambulatory Visit (HOSPITAL_COMMUNITY)
Admission: RE | Admit: 2016-09-12 | Discharge: 2016-09-12 | Disposition: A | Discharge status: Home / Self Care | Payer: BLUE CROSS/BLUE SHIELD | Source: Ambulatory Visit | Attending: Family Medicine | Admitting: Family Medicine

## 2016-09-12 ENCOUNTER — Other Ambulatory Visit: Payer: Self-pay | Admitting: Physician Assistant

## 2016-09-12 DIAGNOSIS — Z780 Asymptomatic menopausal state: Secondary | ICD-10-CM | POA: Diagnosis not present

## 2016-09-12 DIAGNOSIS — M8588 Other specified disorders of bone density and structure, other site: Secondary | ICD-10-CM | POA: Diagnosis not present

## 2016-09-12 DIAGNOSIS — Z0000 Encounter for general adult medical examination without abnormal findings: Secondary | ICD-10-CM | POA: Diagnosis not present

## 2016-09-12 DIAGNOSIS — Z124 Encounter for screening for malignant neoplasm of cervix: Secondary | ICD-10-CM | POA: Insufficient documentation

## 2016-09-13 LAB — CYTOLOGY - PAP: Diagnosis: NEGATIVE

## 2016-12-27 DIAGNOSIS — K648 Other hemorrhoids: Secondary | ICD-10-CM | POA: Diagnosis not present

## 2016-12-27 DIAGNOSIS — Z86010 Personal history of colonic polyps: Secondary | ICD-10-CM | POA: Diagnosis not present

## 2016-12-27 DIAGNOSIS — K6389 Other specified diseases of intestine: Secondary | ICD-10-CM | POA: Diagnosis not present

## 2016-12-27 DIAGNOSIS — K621 Rectal polyp: Secondary | ICD-10-CM | POA: Diagnosis not present

## 2016-12-31 DIAGNOSIS — K621 Rectal polyp: Secondary | ICD-10-CM | POA: Diagnosis not present

## 2017-08-01 ENCOUNTER — Other Ambulatory Visit: Payer: Self-pay | Admitting: Family Medicine

## 2017-08-01 DIAGNOSIS — R42 Dizziness and giddiness: Secondary | ICD-10-CM

## 2017-08-01 DIAGNOSIS — R202 Paresthesia of skin: Secondary | ICD-10-CM

## 2017-08-01 DIAGNOSIS — H539 Unspecified visual disturbance: Secondary | ICD-10-CM

## 2017-08-01 DIAGNOSIS — Z1231 Encounter for screening mammogram for malignant neoplasm of breast: Secondary | ICD-10-CM | POA: Diagnosis not present

## 2017-08-15 ENCOUNTER — Ambulatory Visit
Admission: RE | Admit: 2017-08-15 | Discharge: 2017-08-15 | Disposition: A | Discharge status: Home / Self Care | Payer: BLUE CROSS/BLUE SHIELD | Source: Ambulatory Visit | Attending: Family Medicine | Admitting: Family Medicine

## 2017-08-15 DIAGNOSIS — R202 Paresthesia of skin: Secondary | ICD-10-CM

## 2017-08-15 DIAGNOSIS — H539 Unspecified visual disturbance: Secondary | ICD-10-CM

## 2017-08-15 DIAGNOSIS — R200 Anesthesia of skin: Secondary | ICD-10-CM | POA: Diagnosis not present

## 2017-08-15 DIAGNOSIS — R42 Dizziness and giddiness: Secondary | ICD-10-CM

## 2017-08-15 MED ORDER — GADOBENATE DIMEGLUMINE 529 MG/ML IV SOLN
18.0000 mL | Freq: Once | INTRAVENOUS | Status: AC | PRN
  Administered 2017-08-15: 18 mL via INTRAVENOUS

## 2017-09-18 DIAGNOSIS — R635 Abnormal weight gain: Secondary | ICD-10-CM | POA: Diagnosis not present

## 2017-09-18 DIAGNOSIS — E7800 Pure hypercholesterolemia, unspecified: Secondary | ICD-10-CM | POA: Diagnosis not present

## 2017-09-18 DIAGNOSIS — M8580 Other specified disorders of bone density and structure, unspecified site: Secondary | ICD-10-CM | POA: Diagnosis not present

## 2017-09-18 DIAGNOSIS — Z0000 Encounter for general adult medical examination without abnormal findings: Secondary | ICD-10-CM | POA: Diagnosis not present

## 2017-09-22 ENCOUNTER — Other Ambulatory Visit: Payer: Self-pay | Admitting: Physician Assistant

## 2017-09-22 DIAGNOSIS — R928 Other abnormal and inconclusive findings on diagnostic imaging of breast: Secondary | ICD-10-CM

## 2017-09-26 ENCOUNTER — Ambulatory Visit: Payer: BLUE CROSS/BLUE SHIELD

## 2017-09-26 ENCOUNTER — Ambulatory Visit
Admission: RE | Admit: 2017-09-26 | Discharge: 2017-09-26 | Disposition: A | Discharge status: Home / Self Care | Payer: BLUE CROSS/BLUE SHIELD | Source: Ambulatory Visit | Attending: Physician Assistant | Admitting: Physician Assistant

## 2017-09-26 DIAGNOSIS — R928 Other abnormal and inconclusive findings on diagnostic imaging of breast: Secondary | ICD-10-CM

## 2017-10-24 DIAGNOSIS — R922 Inconclusive mammogram: Secondary | ICD-10-CM | POA: Diagnosis not present

## 2017-10-24 DIAGNOSIS — R928 Other abnormal and inconclusive findings on diagnostic imaging of breast: Secondary | ICD-10-CM | POA: Diagnosis not present

## 2017-10-24 DIAGNOSIS — N6310 Unspecified lump in the right breast, unspecified quadrant: Secondary | ICD-10-CM | POA: Diagnosis not present

## 2018-08-28 DIAGNOSIS — Z1231 Encounter for screening mammogram for malignant neoplasm of breast: Secondary | ICD-10-CM | POA: Diagnosis not present

## 2018-09-24 DIAGNOSIS — Z0000 Encounter for general adult medical examination without abnormal findings: Secondary | ICD-10-CM | POA: Diagnosis not present

## 2018-09-24 DIAGNOSIS — E7800 Pure hypercholesterolemia, unspecified: Secondary | ICD-10-CM | POA: Diagnosis not present

## 2019-08-31 DIAGNOSIS — Z1231 Encounter for screening mammogram for malignant neoplasm of breast: Secondary | ICD-10-CM | POA: Diagnosis not present

## 2019-10-01 DIAGNOSIS — N6002 Solitary cyst of left breast: Secondary | ICD-10-CM | POA: Diagnosis not present

## 2019-10-12 ENCOUNTER — Other Ambulatory Visit (HOSPITAL_COMMUNITY)
Admission: RE | Admit: 2019-10-12 | Discharge: 2019-10-12 | Disposition: A | Discharge status: Home / Self Care | Payer: BC Managed Care – PPO | Source: Ambulatory Visit | Attending: Physician Assistant | Admitting: Physician Assistant

## 2019-10-12 ENCOUNTER — Other Ambulatory Visit: Payer: Self-pay | Admitting: Physician Assistant

## 2019-10-12 DIAGNOSIS — Z131 Encounter for screening for diabetes mellitus: Secondary | ICD-10-CM | POA: Diagnosis not present

## 2019-10-12 DIAGNOSIS — Z124 Encounter for screening for malignant neoplasm of cervix: Secondary | ICD-10-CM | POA: Diagnosis not present

## 2019-10-12 DIAGNOSIS — E7800 Pure hypercholesterolemia, unspecified: Secondary | ICD-10-CM | POA: Diagnosis not present

## 2019-10-12 DIAGNOSIS — Z0000 Encounter for general adult medical examination without abnormal findings: Secondary | ICD-10-CM | POA: Diagnosis not present

## 2019-10-15 LAB — CYTOLOGY - PAP: Diagnosis: NEGATIVE

## 2020-08-31 DIAGNOSIS — Z1231 Encounter for screening mammogram for malignant neoplasm of breast: Secondary | ICD-10-CM | POA: Diagnosis not present

## 2020-10-16 DIAGNOSIS — Z23 Encounter for immunization: Secondary | ICD-10-CM | POA: Diagnosis not present

## 2020-10-16 DIAGNOSIS — Z0000 Encounter for general adult medical examination without abnormal findings: Secondary | ICD-10-CM | POA: Diagnosis not present

## 2020-10-16 DIAGNOSIS — E7800 Pure hypercholesterolemia, unspecified: Secondary | ICD-10-CM | POA: Diagnosis not present

## 2020-10-16 DIAGNOSIS — Z131 Encounter for screening for diabetes mellitus: Secondary | ICD-10-CM | POA: Diagnosis not present

## 2020-10-24 DIAGNOSIS — R739 Hyperglycemia, unspecified: Secondary | ICD-10-CM | POA: Diagnosis not present

## 2021-04-06 DIAGNOSIS — Z20822 Contact with and (suspected) exposure to covid-19: Secondary | ICD-10-CM | POA: Diagnosis not present

## 2021-04-27 DIAGNOSIS — R7303 Prediabetes: Secondary | ICD-10-CM | POA: Diagnosis not present

## 2021-04-27 DIAGNOSIS — R7309 Other abnormal glucose: Secondary | ICD-10-CM | POA: Diagnosis not present

## 2021-07-16 DIAGNOSIS — H02881 Meibomian gland dysfunction right upper eyelid: Secondary | ICD-10-CM | POA: Diagnosis not present

## 2021-07-16 DIAGNOSIS — H2513 Age-related nuclear cataract, bilateral: Secondary | ICD-10-CM | POA: Diagnosis not present

## 2021-07-16 DIAGNOSIS — H02882 Meibomian gland dysfunction right lower eyelid: Secondary | ICD-10-CM | POA: Diagnosis not present

## 2021-07-16 DIAGNOSIS — H02822 Cysts of right lower eyelid: Secondary | ICD-10-CM | POA: Diagnosis not present

## 2021-07-30 DIAGNOSIS — H02822 Cysts of right lower eyelid: Secondary | ICD-10-CM | POA: Diagnosis not present

## 2021-10-18 DIAGNOSIS — Z0000 Encounter for general adult medical examination without abnormal findings: Secondary | ICD-10-CM | POA: Diagnosis not present

## 2021-10-18 DIAGNOSIS — R7303 Prediabetes: Secondary | ICD-10-CM | POA: Diagnosis not present

## 2021-10-18 DIAGNOSIS — E7800 Pure hypercholesterolemia, unspecified: Secondary | ICD-10-CM | POA: Diagnosis not present

## 2022-10-21 DIAGNOSIS — Z124 Encounter for screening for malignant neoplasm of cervix: Secondary | ICD-10-CM | POA: Diagnosis not present

## 2022-11-13 ENCOUNTER — Other Ambulatory Visit: Payer: Self-pay | Admitting: Physician Assistant

## 2022-11-13 DIAGNOSIS — Z1231 Encounter for screening mammogram for malignant neoplasm of breast: Secondary | ICD-10-CM

## 2022-11-14 ENCOUNTER — Ambulatory Visit
Admission: RE | Admit: 2022-11-14 | Discharge: 2022-11-14 | Disposition: A | Discharge status: Home / Self Care | Payer: 59 | Source: Ambulatory Visit | Attending: Physician Assistant | Admitting: Physician Assistant

## 2022-11-14 DIAGNOSIS — Z1231 Encounter for screening mammogram for malignant neoplasm of breast: Secondary | ICD-10-CM | POA: Diagnosis not present

## 2022-12-20 DIAGNOSIS — L255 Unspecified contact dermatitis due to plants, except food: Secondary | ICD-10-CM | POA: Diagnosis not present

## 2022-12-31 DIAGNOSIS — Z6835 Body mass index (BMI) 35.0-35.9, adult: Secondary | ICD-10-CM | POA: Diagnosis not present

## 2022-12-31 DIAGNOSIS — Z8249 Family history of ischemic heart disease and other diseases of the circulatory system: Secondary | ICD-10-CM | POA: Diagnosis not present

## 2022-12-31 DIAGNOSIS — L309 Dermatitis, unspecified: Secondary | ICD-10-CM | POA: Diagnosis not present

## 2022-12-31 DIAGNOSIS — E785 Hyperlipidemia, unspecified: Secondary | ICD-10-CM | POA: Diagnosis not present

## 2023-01-21 DIAGNOSIS — E7800 Pure hypercholesterolemia, unspecified: Secondary | ICD-10-CM | POA: Diagnosis not present

## 2023-03-21 DIAGNOSIS — F430 Acute stress reaction: Secondary | ICD-10-CM | POA: Diagnosis not present

## 2023-06-30 DIAGNOSIS — R609 Edema, unspecified: Secondary | ICD-10-CM | POA: Diagnosis not present

## 2023-06-30 DIAGNOSIS — M545 Low back pain, unspecified: Secondary | ICD-10-CM | POA: Diagnosis not present

## 2024-01-06 ENCOUNTER — Other Ambulatory Visit: Payer: Self-pay | Admitting: Physician Assistant

## 2024-01-06 DIAGNOSIS — Z1231 Encounter for screening mammogram for malignant neoplasm of breast: Secondary | ICD-10-CM

## 2024-01-19 ENCOUNTER — Ambulatory Visit
Admission: RE | Admit: 2024-01-19 | Discharge: 2024-01-19 | Disposition: A | Discharge status: Home / Self Care | Source: Ambulatory Visit | Attending: Physician Assistant | Admitting: Physician Assistant

## 2024-01-19 DIAGNOSIS — Z1231 Encounter for screening mammogram for malignant neoplasm of breast: Secondary | ICD-10-CM

## 2025-01-21 ENCOUNTER — Encounter
